# Patient Record
Sex: Female | Born: 1985 | Race: White | Hispanic: No | Marital: Married | Smoking: Never smoker
Health system: Southern US, Community
[De-identification: ages and names within clinical notes are randomized; demographics above are authoritative.]

## PROBLEM LIST (undated history)

## (undated) DIAGNOSIS — Z789 Other specified health status: Secondary | ICD-10-CM

## (undated) HISTORY — PX: TONSILLECTOMY: SUR1361

---
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
## 2003-11-04 ENCOUNTER — Other Ambulatory Visit: Admission: RE | Admit: 2003-11-04 | Discharge: 2003-11-04 | Payer: Self-pay | Admitting: Obstetrics & Gynecology

## 2005-01-10 ENCOUNTER — Other Ambulatory Visit: Admission: RE | Admit: 2005-01-10 | Discharge: 2005-01-10 | Payer: Self-pay | Admitting: Gynecology

## 2005-06-27 ENCOUNTER — Ambulatory Visit (HOSPITAL_COMMUNITY): Admission: RE | Admit: 2005-06-27 | Discharge: 2005-06-27 | Payer: Self-pay | Admitting: Family Medicine

## 2005-07-12 ENCOUNTER — Ambulatory Visit (HOSPITAL_COMMUNITY): Admission: RE | Admit: 2005-07-12 | Discharge: 2005-07-12 | Payer: Self-pay | Admitting: Urology

## 2006-02-27 ENCOUNTER — Other Ambulatory Visit: Admission: RE | Admit: 2006-02-27 | Discharge: 2006-02-27 | Payer: Self-pay | Admitting: Gynecology

## 2007-03-11 ENCOUNTER — Other Ambulatory Visit: Admission: RE | Admit: 2007-03-11 | Discharge: 2007-03-11 | Payer: Self-pay | Admitting: Gynecology

## 2007-07-23 ENCOUNTER — Ambulatory Visit (HOSPITAL_COMMUNITY): Admission: RE | Admit: 2007-07-23 | Discharge: 2007-07-23 | Payer: Self-pay | Admitting: Family Medicine

## 2007-07-25 ENCOUNTER — Ambulatory Visit (HOSPITAL_COMMUNITY): Admission: RE | Admit: 2007-07-25 | Discharge: 2007-07-25 | Payer: Self-pay | Admitting: Family Medicine

## 2009-12-28 LAB — RUBELLA ANTIBODY, IGM: Rubella: IMMUNE

## 2010-01-23 NOTE — L&D Delivery Note (Signed)
Delivery Note At 2:53 PM a viable female was delivered via Vaginal, Spontaneous Delivery (Presentation: Left Occiput Anterior).  APGAR: 9, 9; weight 8 lb 9.4 oz (3895 g).   Placenta status: Intact, Spontaneous.  Cord: 3 vessels with the following complications: None.  Cord pH: pending  Anesthesia: Epidural  Episiotomy: None Lacerations: 2nd degree Suture Repair: 2.0 chromic Est. Blood Loss (mL): 350  Mom to postpartum.  Baby to nursery-stable.  Annalyse Langlais C 08/07/2010, 3:14 PM

## 2010-07-11 LAB — STREP B DNA PROBE: GBS: NEGATIVE

## 2010-08-07 ENCOUNTER — Encounter (HOSPITAL_COMMUNITY): Payer: Self-pay | Admitting: Anesthesiology

## 2010-08-07 ENCOUNTER — Encounter (HOSPITAL_COMMUNITY): Payer: Self-pay | Admitting: Obstetrics and Gynecology

## 2010-08-07 ENCOUNTER — Inpatient Hospital Stay (HOSPITAL_COMMUNITY)
Admission: AD | Admit: 2010-08-07 | Discharge: 2010-08-09 | DRG: 775 | Disposition: A | Discharge status: Home / Self Care | Payer: PRIVATE HEALTH INSURANCE | Attending: Obstetrics and Gynecology | Admitting: Obstetrics and Gynecology

## 2010-08-07 HISTORY — DX: Other specified health status: Z78.9

## 2010-08-07 LAB — CBC
Platelets: 276 10*3/uL (ref 150–400)
RBC: 4.07 MIL/uL (ref 3.87–5.11)
RDW: 13.4 % (ref 11.5–15.5)
WBC: 11.9 10*3/uL — ABNORMAL HIGH (ref 4.0–10.5)

## 2010-08-07 LAB — RPR: RPR Ser Ql: NONREACTIVE

## 2010-08-07 MED ORDER — LACTATED RINGERS IV SOLN
INTRAVENOUS | Status: DC
  Administered 2010-08-07: 04:00:00 via INTRAVENOUS
  Administered 2010-08-07: 125 mL/h via INTRAVENOUS

## 2010-08-07 MED ORDER — OXYCODONE-ACETAMINOPHEN 5-325 MG PO TABS: 2.0000 | ORAL_TABLET | ORAL | Status: DC | PRN

## 2010-08-07 MED ORDER — OXYTOCIN 20 UNITS IN LACTATED RINGERS INFUSION - SIMPLE: 125.0000 mL/h | INTRAVENOUS | Status: DC | PRN

## 2010-08-07 MED ORDER — FLEET ENEMA 7-19 GM/118ML RE ENEM: 1.0000 | ENEMA | RECTAL | Status: DC | PRN

## 2010-08-07 MED ORDER — OXYTOCIN 20 UNITS IN LACTATED RINGERS INFUSION - SIMPLE
125.0000 mL/h | Freq: Once | INTRAVENOUS | Status: AC
  Administered 2010-08-07: 999 mL/h via INTRAVENOUS
  Filled 2010-08-07: qty 1000

## 2010-08-07 MED ORDER — ONDANSETRON HCL 4 MG/2ML IJ SOLN: 4.0000 mg | Freq: Four times a day (QID) | INTRAMUSCULAR | Status: DC | PRN

## 2010-08-07 MED ORDER — LIDOCAINE HCL (PF) 1 % IJ SOLN
30.0000 mL | INTRAMUSCULAR | Status: DC | PRN
  Filled 2010-08-07 (×2): qty 30

## 2010-08-07 MED ORDER — SENNOSIDES-DOCUSATE SODIUM 8.6-50 MG PO TABS
1.0000 | ORAL_TABLET | Freq: Every day | ORAL | Status: DC
  Administered 2010-08-07: 2 via ORAL
  Administered 2010-08-08: 1 via ORAL

## 2010-08-07 MED ORDER — DIPHENHYDRAMINE HCL 25 MG PO CAPS: 25.0000 mg | ORAL_CAPSULE | Freq: Four times a day (QID) | ORAL | Status: DC | PRN

## 2010-08-07 MED ORDER — IBUPROFEN 600 MG PO TABS
600.0000 mg | ORAL_TABLET | Freq: Four times a day (QID) | ORAL | Status: DC
  Administered 2010-08-07 – 2010-08-09 (×7): 600 mg via ORAL
  Filled 2010-08-07 (×8): qty 1

## 2010-08-07 MED ORDER — LIDOCAINE HCL (PF) 2 % IJ SOLN
INTRAMUSCULAR | Status: DC | PRN
  Administered 2010-08-07: 40 mg
  Administered 2010-08-07: 60 mg
  Administered 2010-08-07: 20 mg

## 2010-08-07 MED ORDER — EPHEDRINE 5 MG/ML INJ
10.0000 mg | INTRAVENOUS | Status: DC | PRN
  Filled 2010-08-07 (×2): qty 4

## 2010-08-07 MED ORDER — PHENYLEPHRINE 40 MCG/ML (10ML) SYRINGE FOR IV PUSH (FOR BLOOD PRESSURE SUPPORT)
80.0000 ug | PREFILLED_SYRINGE | INTRAVENOUS | Status: DC | PRN
  Filled 2010-08-07: qty 5

## 2010-08-07 MED ORDER — BENZOCAINE-MENTHOL 20-0.5 % EX AERO
INHALATION_SPRAY | CUTANEOUS | Status: AC
  Filled 2010-08-07: qty 56

## 2010-08-07 MED ORDER — PRENATAL PLUS 27-1 MG PO TABS
1.0000 | ORAL_TABLET | Freq: Every day | ORAL | Status: DC
  Administered 2010-08-08: 1 via ORAL
  Filled 2010-08-07: qty 1

## 2010-08-07 MED ORDER — LACTATED RINGERS IV SOLN: 500.0000 mL | Freq: Once | INTRAVENOUS | Status: DC

## 2010-08-07 MED ORDER — IBUPROFEN 600 MG PO TABS: 600.0000 mg | ORAL_TABLET | Freq: Four times a day (QID) | ORAL | Status: DC | PRN

## 2010-08-07 MED ORDER — ACETAMINOPHEN 325 MG PO TABS: 650.0000 mg | ORAL_TABLET | ORAL | Status: DC | PRN

## 2010-08-07 MED ORDER — LANOLIN HYDROUS EX OINT: TOPICAL_OINTMENT | CUTANEOUS | Status: DC | PRN

## 2010-08-07 MED ORDER — WITCH HAZEL-GLYCERIN EX PADS: MEDICATED_PAD | CUTANEOUS | Status: DC | PRN

## 2010-08-07 MED ORDER — PHENYLEPHRINE 40 MCG/ML (10ML) SYRINGE FOR IV PUSH (FOR BLOOD PRESSURE SUPPORT)
80.0000 ug | PREFILLED_SYRINGE | INTRAVENOUS | Status: DC | PRN
  Filled 2010-08-07 (×2): qty 5

## 2010-08-07 MED ORDER — ONDANSETRON HCL 4 MG PO TABS: 4.0000 mg | ORAL_TABLET | ORAL | Status: DC | PRN

## 2010-08-07 MED ORDER — CITRIC ACID-SODIUM CITRATE 334-500 MG/5ML PO SOLN: 30.0000 mL | ORAL | Status: DC | PRN

## 2010-08-07 MED ORDER — OXYCODONE-ACETAMINOPHEN 5-325 MG PO TABS
1.0000 | ORAL_TABLET | ORAL | Status: DC | PRN
  Filled 2010-08-07: qty 2

## 2010-08-07 MED ORDER — FENTANYL 2.5 MCG/ML BUPIVACAINE 1/10 % EPIDURAL INFUSION (WH - ANES)
14.0000 mL/h | INTRAMUSCULAR | Status: DC
  Administered 2010-08-07 (×3): 14 mL/h via EPIDURAL
  Filled 2010-08-07 (×3): qty 60

## 2010-08-07 MED ORDER — EPHEDRINE 5 MG/ML INJ
10.0000 mg | INTRAVENOUS | Status: DC | PRN
  Filled 2010-08-07: qty 4

## 2010-08-07 MED ORDER — ONDANSETRON HCL 4 MG/2ML IJ SOLN: 4.0000 mg | INTRAMUSCULAR | Status: DC | PRN

## 2010-08-07 MED ORDER — MEDROXYPROGESTERONE ACETATE 150 MG/ML IM SUSP: 150.0000 mg | INTRAMUSCULAR | Status: DC | PRN

## 2010-08-07 MED ORDER — LACTATED RINGERS IV SOLN
500.0000 mL | INTRAVENOUS | Status: AC | PRN
  Administered 2010-08-07: 500 mL via INTRAVENOUS

## 2010-08-07 MED ORDER — DIPHENHYDRAMINE HCL 50 MG/ML IJ SOLN: 12.5000 mg | INTRAMUSCULAR | Status: DC | PRN

## 2010-08-07 MED ORDER — BENZOCAINE-MENTHOL 20-0.5 % EX AERO: 1.0000 "application " | INHALATION_SPRAY | CUTANEOUS | Status: DC | PRN

## 2010-08-07 MED ORDER — MEASLES, MUMPS & RUBELLA VAC ~~LOC~~ INJ
0.5000 mL | INJECTION | Freq: Once | SUBCUTANEOUS | Status: DC
  Filled 2010-08-07: qty 0.5

## 2010-08-07 MED ORDER — TETANUS-DIPHTH-ACELL PERTUSSIS 5-2.5-18.5 LF-MCG/0.5 IM SUSP
0.5000 mL | Freq: Once | INTRAMUSCULAR | Status: AC
  Administered 2010-08-08: 0.5 mL via INTRAMUSCULAR
  Filled 2010-08-07: qty 0.5

## 2010-08-07 MED ORDER — SIMETHICONE 80 MG PO CHEW: 80.0000 mg | CHEWABLE_TABLET | ORAL | Status: DC | PRN

## 2010-08-07 MED ORDER — ZOLPIDEM TARTRATE 5 MG PO TABS: 5.0000 mg | ORAL_TABLET | Freq: Every evening | ORAL | Status: DC | PRN

## 2010-08-07 NOTE — ED Notes (Signed)
Report called to [employer] in [facility]

## 2010-08-07 NOTE — Progress Notes (Signed)
G1 at 40wks. Ctxs since Friday but closer for last 2 hrs. Some mucousy d/c.

## 2010-08-07 NOTE — ED Notes (Signed)
To BS via w/c °

## 2010-08-07 NOTE — H&P (Signed)
Joanna Murphy is a 25 y.o. female presenting for evaluation of labor with regular strong contractions. History OB History    Grav Para Term Preterm Abortions TAB SAB Ect Mult Living   1         0     Past Medical History  Diagnosis Date  . No pertinent past medical history    Past Surgical History  Procedure Date  . Tonsillectomy   . Knee arthroscopy 2001    R knee   Family History: family history includes Hypertension in her father. Social History:  reports that she has never smoked. She does not have any smokeless tobacco history on file. She reports that she does not drink alcohol or use illicit drugs.  ROS  Dilation: 6.5 Effacement (%): 90 Station: -1 Exam by:: B.Cagna,RN Blood pressure 123/69, pulse 93, temperature 98.3 F (36.8 C), temperature source Oral, resp. rate 18, height 5' 4.5" (1.638 m), weight 87.544 kg (193 lb). Examexam per triage rn Physical Exam  Prenatal labs: ABO, Rh:   Antibody: Negative (12/06 0000) Rubella:   RPR: Nonreactive (12/06 0000)  HBsAg: Negative (12/06 0000)  HIV:    GBS: Negative (06/18 0000)   Assessment/Plan: Term pregnancy with no prenatal complications.  Plan arom and anticipate svd   Ferdinando Lodge C 08/07/2010, 9:03 AM

## 2010-08-07 NOTE — Progress Notes (Signed)
Dr Rana Snare notified of pt's admission and status. Aware of ctx pattern, sve, and irreg. FHR. FHR reactive. Will walk an hour and reck cervix. If no change after an hour pt maygo home with [place]

## 2010-08-07 NOTE — ED Notes (Signed)
Pt up to walk with spouse

## 2010-08-07 NOTE — Anesthesia Procedure Notes (Addendum)
Epidural Patient location during procedure: OB Start time: 08/07/2010 5:53 AM  Staffing Anesthesiologist: Jiles Garter  Preanesthetic Checklist Completed: patient identified, site marked, surgical consent, pre-op evaluation, timeout performed, IV checked, risks and benefits discussed and monitors and equipment checked  Epidural Patient position: sitting Prep: DuraPrep Patient monitoring: continuous pulse ox and blood pressure Approach: midline Injection technique: LOR air  Needle:  Needle type: Tuohy  Needle gauge: 17 G Needle length: 9 cm Needle insertion depth: 6 cm Catheter type: closed end flexible Catheter size: 19 Gauge Catheter at skin depth: 10 and 12 cm Test dose: negative  Assessment Events: blood not aspirated, injection not painful, no injection resistance, negative IV test and no paresthesia  Patient is more comfortable after epidural dosed. Please see RN's note for documentation of vital signs,and FHR which are stable.

## 2010-08-07 NOTE — Anesthesia Preprocedure Evaluation (Addendum)
Anesthesia Evaluation  Name, MR# and DOB Patient awake  General Assessment Comment  Reviewed: Allergy & Precautions, H&P  and Patient's Chart, lab work & pertinent test results  Airway Mallampati: II TM Distance: >3 FB Neck ROM: full    Dental  (+) Teeth Intact   Pulmonary  clear to auscultation    Cardiovascular regular Normal   Neuro/Psych  GI/Hepatic/Renal (+)  GERD Medicated and Controlled     Endo/Other   Abdominal   Musculoskeletal  Hematology   Peds  Reproductive/Obstetrics (+) Pregnancy   Anesthesia Other Findings   276k plts              Anesthesia Physical Anesthesia Plan  ASA: II  Anesthesia Plan: Epidural   Post-op Pain Management:    Induction:   Airway Management Planned:   Additional Equipment:   Intra-op Plan:   Post-operative Plan:   Informed Consent: I have reviewed the patients History and Physical, chart, labs and discussed the procedure including the risks, benefits and alternatives for the proposed anesthesia with the patient or authorized representative who has indicated his/her understanding and acceptance.   Dental Advisory Given  Plan Discussed with: CRNA and Surgeon  Anesthesia Plan Comments: (Labs checked- platelets confirmed with RN in room. Fetal heart tracing, per RN, reportedly stable enough for sitting procedure. Discussed epidural, and patient consents to the procedure:  included risk of possible headache,backache, failed block, allergic reaction, and nerve injury. This patient was asked if she had any questions or concerns before the procedure started. )        Anesthesia Quick Evaluation

## 2010-08-08 LAB — CBC
HCT: 34.9 % — ABNORMAL LOW (ref 36.0–46.0)
Hemoglobin: 12 g/dL (ref 12.0–15.0)
MCH: 32.1 pg (ref 26.0–34.0)
MCV: 93.3 fL (ref 78.0–100.0)
RBC: 3.74 MIL/uL — ABNORMAL LOW (ref 3.87–5.11)

## 2010-08-08 LAB — KLEIHAUER-BETKE STAIN: Fetal Cells %: 0 %

## 2010-08-08 MED ORDER — RHO D IMMUNE GLOBULIN 1500 UNIT/2ML IJ SOLN
300.0000 ug | Freq: Once | INTRAMUSCULAR | Status: AC
  Administered 2010-08-08: 300 ug via INTRAMUSCULAR

## 2010-08-08 NOTE — Progress Notes (Signed)
Mom needs more practice in bringing baby to breast, waiting for wide gape; and then following through w/latch.  Joanna Murphy

## 2010-08-08 NOTE — Progress Notes (Addendum)
Post Partum Day 1 Subjective: no complaints  Objective: Blood pressure 108/68, pulse 84, temperature 97.7 F (36.5 C), temperature source Oral, resp. rate 14, height 5' 4.5" (1.638 m), weight 87.544 kg (193 lb), SpO2 98.00%, unknown if currently breastfeeding.  Physical Exam:  General: alert and no distress Lochia: appropriate Uterine Fundus: firm U/Even  DVT Evaluation: No evidence of DVT seen on physical exam.   Basename 08/08/10 0528 08/07/10 0343  HGB 12.0 12.8  HCT 34.9* 38.2    Assessment/Plan: Rh-, Rhogam if indicated Plan for discharge tomorrow   LOS: 1 day   CURTIS,CAROL G 08/08/2010, 7:51 AM    agree

## 2010-08-08 NOTE — Plan of Care (Signed)
Problem: Phase I Progression Outcomes Goal: Pain controlled with appropriate interventions Outcome: Progressing Patient claim good pain control with  Ibuprofen 600 mgs PO every 6 hr. Goal: Voiding adequately Outcome: Completed/Met Date Met:  08/08/10   Voided several times post partum without any problems. Goal: OOB as tolerated unless otherwise ordered Outcome: Adequate for Discharge Ambulated to bathroom and room independently without any problems. Goal: Initial discharge plan identified Outcome: Progressing  Informed patient and FOB of possible discharge date.

## 2010-08-09 LAB — RH IG WORKUP (INCLUDES ABO/RH): Unit division: 0

## 2010-08-09 MED ORDER — IBUPROFEN 600 MG PO TABS: 600.0000 mg | ORAL_TABLET | Freq: Four times a day (QID) | ORAL | Status: AC

## 2010-08-09 NOTE — Discharge Summary (Signed)
Obstetric Discharge Summary Reason for Admission: onset of labor Prenatal Procedures: none Intrapartum Procedures: spontaneous vaginal delivery Postpartum Procedures: Rho(D) Ig Complications-Operative and Postpartum: none  Hemoglobin  Date Value Range Status  08/08/2010 12.0  12.0-15.0 (g/dL) Final     HCT  Date Value Range Status  08/08/2010 34.9* 36.0-46.0 (%) Final    Discharge Diagnoses: Term Pregnancy-delivered  Discharge Information: Date: 08/09/2010 Activity: pelvic rest Diet: routine Medications: PNV and Ibuprophen Condition: stable Instructions: refer to practice specific booklet Discharge to: home   Newborn Data: Live born  Information for the patient's newborn:  Ilma, Achee [[id number]]  female  Home with mother.  Tabitha Riggins G 08/09/2010, 7:55 AM

## 2010-08-09 NOTE — Progress Notes (Signed)
Post Partum Day 2 Subjective: no complaints  Objective: Blood pressure 92/65, pulse 90, temperature 98.2 F (36.8 C), temperature source Oral, resp. rate 16, height 5' 4.5" (1.638 m), weight 87.544 kg (193 lb), SpO2 98.00%, unknown if currently breastfeeding.  Physical Exam:  General: alert, cooperative and no distress Lochia: appropriate Uterine Fundus: firm Perineum intact DVT Evaluation: No evidence of DVT seen on physical exam.   Basename 08/08/10 0528 08/07/10 0343  HGB 12.0 12.8  HCT 34.9* 38.2    Assessment/Plan: Discharge home RTO in 4-6 weeks   LOS: 2 days   Alahna Dunne G 08/09/2010, 7:53 AM

## 2013-01-23 NOTE — L&D Delivery Note (Signed)
Precipitous delivery of VFI at 0728 on 11/11/13.  EBL 200cc.  Placenta to L&D.  APGARs 9,9. Delivery occurred at change of shift.  Dr. Henderson Cloudomblin (coming off call) was awaiting delivery call in lounge.  RN called out for precipitous delivery and Dr. Tenny Crawoss was at nurse's station and responded.  Baby was delivered by RN prior to MD arrival.  Per RN, uncomplicated delivery.  On my arrival, vigorous baby on abdomen with intact cord.  Cord was clamped and cut.  Cord blood was obtained.  Placenta delivered S/I/3VC.  Fundus was firmed with pitocin and massage.  2nd degree perineal lac noted.  1% lido with epi injected.  3-0 vicryl used to repair lac in the normal fashion.  Mom and baby stable.   Joanna HonourMegan Romelle Muldoon, DO

## 2013-04-09 LAB — OB RESULTS CONSOLE RUBELLA ANTIBODY, IGM: RUBELLA: IMMUNE

## 2013-04-09 LAB — OB RESULTS CONSOLE GBS
GBS: POSITIVE
GBS: POSITIVE

## 2013-04-09 LAB — OB RESULTS CONSOLE RPR: RPR: NONREACTIVE

## 2013-04-09 LAB — OB RESULTS CONSOLE ABO/RH: RH TYPE: NEGATIVE

## 2013-04-09 LAB — OB RESULTS CONSOLE HIV ANTIBODY (ROUTINE TESTING): HIV: NONREACTIVE

## 2013-04-09 LAB — OB RESULTS CONSOLE HEPATITIS B SURFACE ANTIGEN: HEP B S AG: NEGATIVE

## 2013-04-09 LAB — OB RESULTS CONSOLE ANTIBODY SCREEN: Antibody Screen: NEGATIVE

## 2013-11-11 ENCOUNTER — Inpatient Hospital Stay (HOSPITAL_COMMUNITY)
Admission: AD | Admit: 2013-11-11 | Discharge: 2013-11-13 | DRG: 775 | Disposition: A | Discharge status: Home / Self Care | Payer: BC Managed Care – PPO | Source: Ambulatory Visit | Attending: Obstetrics & Gynecology | Admitting: Obstetrics & Gynecology

## 2013-11-11 ENCOUNTER — Encounter (HOSPITAL_COMMUNITY): Payer: Self-pay | Admitting: *Deleted

## 2013-11-11 DIAGNOSIS — Z3A39 39 weeks gestation of pregnancy: Secondary | ICD-10-CM | POA: Diagnosis present

## 2013-11-11 DIAGNOSIS — O471 False labor at or after 37 completed weeks of gestation: Secondary | ICD-10-CM | POA: Diagnosis present

## 2013-11-11 DIAGNOSIS — Z3490 Encounter for supervision of normal pregnancy, unspecified, unspecified trimester: Secondary | ICD-10-CM

## 2013-11-11 DIAGNOSIS — Z8249 Family history of ischemic heart disease and other diseases of the circulatory system: Secondary | ICD-10-CM | POA: Diagnosis not present

## 2013-11-11 LAB — CBC
HCT: 37.1 % (ref 36.0–46.0)
Hemoglobin: 13 g/dL (ref 12.0–15.0)
MCH: 32.2 pg (ref 26.0–34.0)
MCHC: 35 g/dL (ref 30.0–36.0)
MCV: 91.8 fL (ref 78.0–100.0)
PLATELETS: 270 10*3/uL (ref 150–400)
RBC: 4.04 MIL/uL (ref 3.87–5.11)
RDW: 13.9 % (ref 11.5–15.5)
WBC: 14.4 10*3/uL — ABNORMAL HIGH (ref 4.0–10.5)

## 2013-11-11 LAB — RPR

## 2013-11-11 MED ORDER — BENZOCAINE-MENTHOL 20-0.5 % EX AERO
1.0000 "application " | INHALATION_SPRAY | CUTANEOUS | Status: DC | PRN
  Administered 2013-11-11: 1 via TOPICAL
  Filled 2013-11-11: qty 56

## 2013-11-11 MED ORDER — SIMETHICONE 80 MG PO CHEW
80.0000 mg | CHEWABLE_TABLET | ORAL | Status: DC | PRN
  Administered 2013-11-11: 80 mg via ORAL
  Filled 2013-11-11: qty 1

## 2013-11-11 MED ORDER — DIBUCAINE 1 % RE OINT
1.0000 "application " | TOPICAL_OINTMENT | RECTAL | Status: DC | PRN
  Administered 2013-11-12: 1 via RECTAL
  Filled 2013-11-11: qty 28

## 2013-11-11 MED ORDER — ONDANSETRON HCL 4 MG/2ML IJ SOLN: 4.0000 mg | INTRAMUSCULAR | Status: DC | PRN

## 2013-11-11 MED ORDER — LACTATED RINGERS IV SOLN
INTRAVENOUS | Status: DC
  Administered 2013-11-11: 06:00:00 via INTRAVENOUS

## 2013-11-11 MED ORDER — PRENATAL MULTIVITAMIN CH
1.0000 | ORAL_TABLET | Freq: Every day | ORAL | Status: DC
  Administered 2013-11-11 – 2013-11-12 (×2): 1 via ORAL
  Filled 2013-11-11 (×2): qty 1

## 2013-11-11 MED ORDER — OXYCODONE-ACETAMINOPHEN 5-325 MG PO TABS: 1.0000 | ORAL_TABLET | ORAL | Status: DC | PRN

## 2013-11-11 MED ORDER — ONDANSETRON HCL 4 MG PO TABS
4.0000 mg | ORAL_TABLET | ORAL | Status: DC | PRN
Start: 2013-11-11 — End: 2013-11-13

## 2013-11-11 MED ORDER — SODIUM CHLORIDE 0.9 % IV SOLN
2.0000 g | Freq: Once | INTRAVENOUS | Status: AC
  Administered 2013-11-11: 2 g via INTRAVENOUS
  Filled 2013-11-11: qty 2000

## 2013-11-11 MED ORDER — ACETAMINOPHEN 325 MG PO TABS: 650.0000 mg | ORAL_TABLET | ORAL | Status: DC | PRN

## 2013-11-11 MED ORDER — IBUPROFEN 600 MG PO TABS
600.0000 mg | ORAL_TABLET | Freq: Four times a day (QID) | ORAL | Status: DC
  Administered 2013-11-11 – 2013-11-13 (×8): 600 mg via ORAL
  Filled 2013-11-11 (×7): qty 1

## 2013-11-11 MED ORDER — OXYTOCIN BOLUS FROM INFUSION
500.0000 mL | INTRAVENOUS | Status: DC
  Administered 2013-11-11: 500 mL via INTRAVENOUS

## 2013-11-11 MED ORDER — PHENYLEPHRINE 40 MCG/ML (10ML) SYRINGE FOR IV PUSH (FOR BLOOD PRESSURE SUPPORT)
80.0000 ug | PREFILLED_SYRINGE | INTRAVENOUS | Status: DC | PRN
  Filled 2013-11-11: qty 2

## 2013-11-11 MED ORDER — LACTATED RINGERS IV SOLN
500.0000 mL | Freq: Once | INTRAVENOUS | Status: AC
  Administered 2013-11-11: 500 mL via INTRAVENOUS

## 2013-11-11 MED ORDER — ONDANSETRON HCL 4 MG/2ML IJ SOLN: 4.0000 mg | Freq: Four times a day (QID) | INTRAMUSCULAR | Status: DC | PRN

## 2013-11-11 MED ORDER — TETANUS-DIPHTH-ACELL PERTUSSIS 5-2.5-18.5 LF-MCG/0.5 IM SUSP
0.5000 mL | Freq: Once | INTRAMUSCULAR | Status: DC
Start: 2013-11-12 — End: 2013-11-13

## 2013-11-11 MED ORDER — OXYTOCIN 40 UNITS IN LACTATED RINGERS INFUSION - SIMPLE MED
62.5000 mL/h | INTRAVENOUS | Status: DC
  Filled 2013-11-11: qty 1000

## 2013-11-11 MED ORDER — ZOLPIDEM TARTRATE 5 MG PO TABS: 5.0000 mg | ORAL_TABLET | Freq: Every evening | ORAL | Status: DC | PRN

## 2013-11-11 MED ORDER — FLEET ENEMA 7-19 GM/118ML RE ENEM: 1.0000 | ENEMA | RECTAL | Status: DC | PRN

## 2013-11-11 MED ORDER — DIPHENHYDRAMINE HCL 50 MG/ML IJ SOLN: 12.5000 mg | INTRAMUSCULAR | Status: DC | PRN

## 2013-11-11 MED ORDER — OXYCODONE-ACETAMINOPHEN 5-325 MG PO TABS
2.0000 | ORAL_TABLET | ORAL | Status: DC | PRN
Start: 2013-11-11 — End: 2013-11-13

## 2013-11-11 MED ORDER — RHO D IMMUNE GLOBULIN 1500 UNIT/2ML IJ SOSY
300.0000 ug | PREFILLED_SYRINGE | Freq: Once | INTRAMUSCULAR | Status: AC
  Administered 2013-11-12: 300 ug via INTRAMUSCULAR
  Filled 2013-11-11: qty 2

## 2013-11-11 MED ORDER — EPHEDRINE 5 MG/ML INJ
10.0000 mg | INTRAVENOUS | Status: DC | PRN
  Filled 2013-11-11: qty 2

## 2013-11-11 MED ORDER — LACTATED RINGERS IV SOLN: 500.0000 mL | INTRAVENOUS | Status: DC | PRN

## 2013-11-11 MED ORDER — CITRIC ACID-SODIUM CITRATE 334-500 MG/5ML PO SOLN: 30.0000 mL | ORAL | Status: DC | PRN

## 2013-11-11 MED ORDER — FENTANYL 2.5 MCG/ML BUPIVACAINE 1/10 % EPIDURAL INFUSION (WH - ANES): 14.0000 mL/h | INTRAMUSCULAR | Status: DC | PRN

## 2013-11-11 MED ORDER — WITCH HAZEL-GLYCERIN EX PADS: 1.0000 "application " | MEDICATED_PAD | CUTANEOUS | Status: DC | PRN

## 2013-11-11 MED ORDER — DIPHENHYDRAMINE HCL 25 MG PO CAPS: 25.0000 mg | ORAL_CAPSULE | Freq: Four times a day (QID) | ORAL | Status: DC | PRN

## 2013-11-11 MED ORDER — SENNOSIDES-DOCUSATE SODIUM 8.6-50 MG PO TABS
2.0000 | ORAL_TABLET | ORAL | Status: DC
  Administered 2013-11-12 – 2013-11-13 (×2): 2 via ORAL
  Filled 2013-11-11 (×2): qty 2

## 2013-11-11 MED ORDER — LIDOCAINE HCL (PF) 1 % IJ SOLN
30.0000 mL | INTRAMUSCULAR | Status: AC | PRN
  Administered 2013-11-11: 30 mL via SUBCUTANEOUS
  Filled 2013-11-11: qty 30

## 2013-11-11 MED ORDER — LANOLIN HYDROUS EX OINT: TOPICAL_OINTMENT | CUTANEOUS | Status: DC | PRN

## 2013-11-11 MED ORDER — OXYCODONE-ACETAMINOPHEN 5-325 MG PO TABS: 2.0000 | ORAL_TABLET | ORAL | Status: DC | PRN

## 2013-11-11 NOTE — Progress Notes (Signed)
Per Cristobal GoldmannAleeta, RN pt may transfer to room 166. Pt transferred via stretcher by Nursing supervisor.

## 2013-11-11 NOTE — H&P (Signed)
Joanna Murphy is a 28 y.o. female presenting for UCs. Maternal Medical History:  Reason for admission: Contractions.   Contractions: Onset was 3-5 hours ago.    Fetal activity: Perceived fetal activity is normal.      OB History   Grav Para Term Preterm Abortions TAB SAB Ect Mult Living   2 1 1       1      Past Medical History  Diagnosis Date  . No pertinent past medical history    Past Surgical History  Procedure Laterality Date  . Tonsillectomy    . Knee arthroscopy  2001    R knee   Family History: family history includes Hypertension in her father. Social History:  reports that she has never smoked. She does not have any smokeless tobacco history on file. She reports that she does not drink alcohol or use illicit drugs.   Prenatal Transfer Tool  Maternal Diabetes: No Genetic Screening: Normal Maternal Ultrasounds/Referrals: Normal Fetal Ultrasounds or other Referrals:  None Maternal Substance Abuse:  No Significant Maternal Medications:  None Significant Maternal Lab Results:  None Other Comments:  None  Review of Systems  Eyes: Negative for blurred vision.  Gastrointestinal: Negative for abdominal pain.  Neurological: Negative for headaches.    Dilation: 10 Effacement (%): 90 Station: 0 Exam by:: thomblin md Blood pressure 131/86, pulse 89, temperature 97.5 F (36.4 C), temperature source Oral, resp. rate 18, height 5\' 4"  (1.626 m), weight 83.915 kg (185 lb), SpO2 100.00%, unknown if currently breastfeeding. Maternal Exam:  Uterine Assessment: Contraction strength is firm.  Contraction frequency is regular.   Abdomen: Patient reports no abdominal tenderness. Fetal presentation: vertex     Fetal Exam Fetal State Assessment: Category I - tracings are normal.     Physical Exam  Cardiovascular: Normal rate.   Respiratory: Effort normal.  GI: Soft.  Neurological: She has normal reflexes.   AROM light meconium Prenatal labs: ABO, Rh:    Antibody:   Rubella:   RPR:    HBsAg:    HIV:    GBS:     Assessment/Plan: 28 yo G2P1 @ 39 2/7 weeks in active labor  Anticipate vaginal delivery  Joanna Murphy,Joanna Murphy 11/11/2013, 6:54 AM

## 2013-11-11 NOTE — MAU Note (Signed)
Pt reports contractions, ? Leaking fluid

## 2013-11-11 NOTE — Plan of Care (Signed)
Problem: Discharge Progression Outcomes Goal: MMR given as ordered Outcome: Not Applicable Date Met:  83/66/29 Rubella immune

## 2013-11-11 NOTE — Lactation Note (Signed)
This note was copied from the chart of [name]. Lactation Consultation Note  Patient Name: [name] HQION'GToday's Date: 11/11/2013 Reason for consult: Initial assessment of this mom and baby at 10 hours postpartum.  Mom is holding her newborn STS and baby is sound asleep.  Mom reports just finishing a breastfeeding of about 30 minutes, with some pauses during feeding.  Mom denies any nipple discomfort and states she knows how to hand express her colostrum/milk.  Mom is second-time breastfeeding mom with a 903 yo whom she breastfed for 7 weeks, stating that she could not continue when returning to work.  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  LC reviewed benefits of breastfeeding and encouraged cue feedings and frequent STS. Mom encouraged to feed baby 8-12 times/24 hours and with feeding cues. LC encouraged review of Baby and Me pp 9, 14 and 20-25 for STS and BF information. LC provided [employer] Resource brochure and reviewed [facility] services and list of community and web site resources.     Maternal Data Has patient been taught Hand Expression?:  (mom states that she knows how to hand express her colostrum/milk) Does the patient have breastfeeding experience prior to this delivery?: Yes  Feeding    LATCH Score/Interventions         Initial LATCH score=9 per RN assessment             Lactation Tools Discussed/Used   STS, cue feedings, hand expression  Consult Status Consult Status: Follow-up Date: 11/12/13 Follow-up type: In-patient    Warrick ParisianBryant, Kyson Kupper [facility] 11/11/2013, 5:32 PM

## 2013-11-11 NOTE — Progress Notes (Signed)
Called out to desk to call dr Henderson Cloudtomblin for delivery, then pulled emergency cord for back up staff

## 2013-11-12 LAB — CBC
HEMATOCRIT: 34.2 % — AB (ref 36.0–46.0)
Hemoglobin: 11.7 g/dL — ABNORMAL LOW (ref 12.0–15.0)
MCH: 32.1 pg (ref 26.0–34.0)
MCHC: 34.2 g/dL (ref 30.0–36.0)
MCV: 94 fL (ref 78.0–100.0)
Platelets: 223 10*3/uL (ref 150–400)
RBC: 3.64 MIL/uL — ABNORMAL LOW (ref 3.87–5.11)
RDW: 14.1 % (ref 11.5–15.5)
WBC: 11.1 10*3/uL — ABNORMAL HIGH (ref 4.0–10.5)

## 2013-11-12 NOTE — Progress Notes (Signed)
Post Partum Day 1 Subjective: no complaints, up ad lib, voiding, tolerating PO and + flatus  Objective: Blood pressure 110/62, pulse 81, temperature 97.6 F (36.4 C), temperature source Oral, resp. rate 18, height 5\' 4"  (1.626 m), weight 185 lb (83.915 kg), SpO2 100.00%, unknown if currently breastfeeding.  Physical Exam:  General: alert and cooperative Lochia: appropriate Uterine Fundus: firm Incision: healing well DVT Evaluation: No evidence of DVT seen on physical exam. Negative Homan's sign. No cords or calf tenderness. No significant calf/ankle edema.   Recent Labs  11/11/13 0620 11/12/13 0610  HGB 13.0 11.7*  HCT 37.1 34.2*    Assessment/Plan: Plan for discharge tomorrow   LOS: 1 day   Yuval Nolet G 11/12/2013, 8:08 AM

## 2013-11-12 NOTE — Lactation Note (Signed)
This note was copied from the chart of Joanna Riki AltesRebekah Pulsifer. Lactation Consultation Note  Follow up visit made.  Mom states baby is latching well and cluster feeding today.  Reassured this is normal behavior and encouraged to continue to feed with any feeding cue. Encouraged to use alternate breast massage and compression during feeding.  Instructed to call for concerns/assist prn.  Patient Name: Joanna Murphy ZOXWR'UToday's Date: 11/12/2013     Maternal Data    Feeding Feeding Type: Breast Fed Length of feed: 15 min  LATCH Score/Interventions                      Lactation Tools Discussed/Used     Consult Status      Huston FoleyMOULDEN, Ruthell Feigenbaum S 11/12/2013, 3:16 PM

## 2013-11-13 LAB — RH IG WORKUP (INCLUDES ABO/RH)
ABO/RH(D): O NEG
Antibody Screen: POSITIVE
Fetal Screen: NEGATIVE
Gestational Age(Wks): 39
Unit division: 0

## 2013-11-13 LAB — TYPE AND SCREEN
ABO/RH(D): O NEG
Antibody Screen: POSITIVE
DAT, IgG: NEGATIVE
Unit division: 0
Unit division: 0

## 2013-11-13 NOTE — Discharge Summary (Signed)
Obstetric Discharge Summary Reason for Admission: onset of labor Prenatal Procedures: ultrasound Intrapartum Procedures: spontaneous vaginal delivery Postpartum Procedures: none Complications-Operative and Postpartum: 2 degree perineal laceration Hemoglobin  Date Value Ref Range Status  11/12/2013 11.7* 12.0 - 15.0 g/dL Final     HCT  Date Value Ref Range Status  11/12/2013 34.2* 36.0 - 46.0 % Final    Physical Exam:  General: alert and cooperative Lochia: appropriate Uterine Fundus: firm Incision: healing well DVT Evaluation: No evidence of DVT seen on physical exam. Negative Homan's sign. No cords or calf tenderness. No significant calf/ankle edema.  Discharge Diagnoses: Term Pregnancy-delivered  Discharge Information: Date: 11/13/2013 Activity: pelvic rest Diet: routine Medications: PNV and Ibuprofen Condition: stable Instructions: refer to practice specific booklet Discharge to: home   Newborn Data: Live born female  Birth Weight: 8 lb 15.7 oz (4075 g) APGAR: 9, 9  Home with mother.  CURTIS,CAROL G 11/13/2013, 8:23 AM

## 2013-11-13 NOTE — Lactation Note (Signed)
This note was copied from the chart of Joanna Murphy Ruggerio. Lactation Consultation Note Baby has 8% wt. Loss. Noted 10 poops, 6 pee's since birth at 47 hrs. Old which is more than adequate. Could explain for some of the wt. Loss. Good I&O. Noted.  Patient Name: Joanna Murphy Robling ZOXWR'UToday's Date: 11/13/2013 Reason for consult: Follow-up assessment;Infant weight loss   Maternal Data    Feeding Feeding Type: Breast Fed Length of feed: 5 min  LATCH Score/Interventions                      Lactation Tools Discussed/Used     Consult Status Consult Status: Follow-up Date: 11/13/13 Follow-up type: In-patient    Charyl DancerCARVER, Larico Dimock G 11/13/2013, 6:39 AM

## 2013-11-24 ENCOUNTER — Encounter (HOSPITAL_COMMUNITY): Payer: Self-pay | Admitting: *Deleted

## 2016-03-15 ENCOUNTER — Other Ambulatory Visit: Payer: Self-pay | Admitting: Obstetrics and Gynecology

## 2016-03-15 DIAGNOSIS — N644 Mastodynia: Secondary | ICD-10-CM

## 2016-03-15 DIAGNOSIS — R922 Inconclusive mammogram: Secondary | ICD-10-CM

## 2016-03-17 ENCOUNTER — Other Ambulatory Visit: Payer: PRIVATE HEALTH INSURANCE

## 2016-03-17 ENCOUNTER — Ambulatory Visit
Admission: RE | Admit: 2016-03-17 | Discharge: 2016-03-17 | Disposition: A | Discharge status: Home / Self Care | Payer: 59 | Source: Ambulatory Visit | Attending: Obstetrics and Gynecology | Admitting: Obstetrics and Gynecology

## 2016-03-17 DIAGNOSIS — R922 Inconclusive mammogram: Secondary | ICD-10-CM

## 2016-03-17 DIAGNOSIS — N644 Mastodynia: Secondary | ICD-10-CM

## 2019-05-29 ENCOUNTER — Other Ambulatory Visit: Payer: Self-pay | Admitting: Allergy and Immunology

## 2019-05-29 ENCOUNTER — Ambulatory Visit
Admission: RE | Admit: 2019-05-29 | Discharge: 2019-05-29 | Disposition: A | Discharge status: Home / Self Care | Payer: 59 | Source: Ambulatory Visit | Attending: Allergy and Immunology | Admitting: Allergy and Immunology

## 2019-05-29 DIAGNOSIS — J4540 Moderate persistent asthma, uncomplicated: Secondary | ICD-10-CM

## 2022-04-17 IMAGING — DX DG CHEST 2V
2 series · 2 of 2 positions shown · non-contrast
Comparison: None.

CLINICAL DATA: Asthma with wheezing

EXAM:
CHEST - 2 VIEW

[dg chest 2 view (1 of 2)]
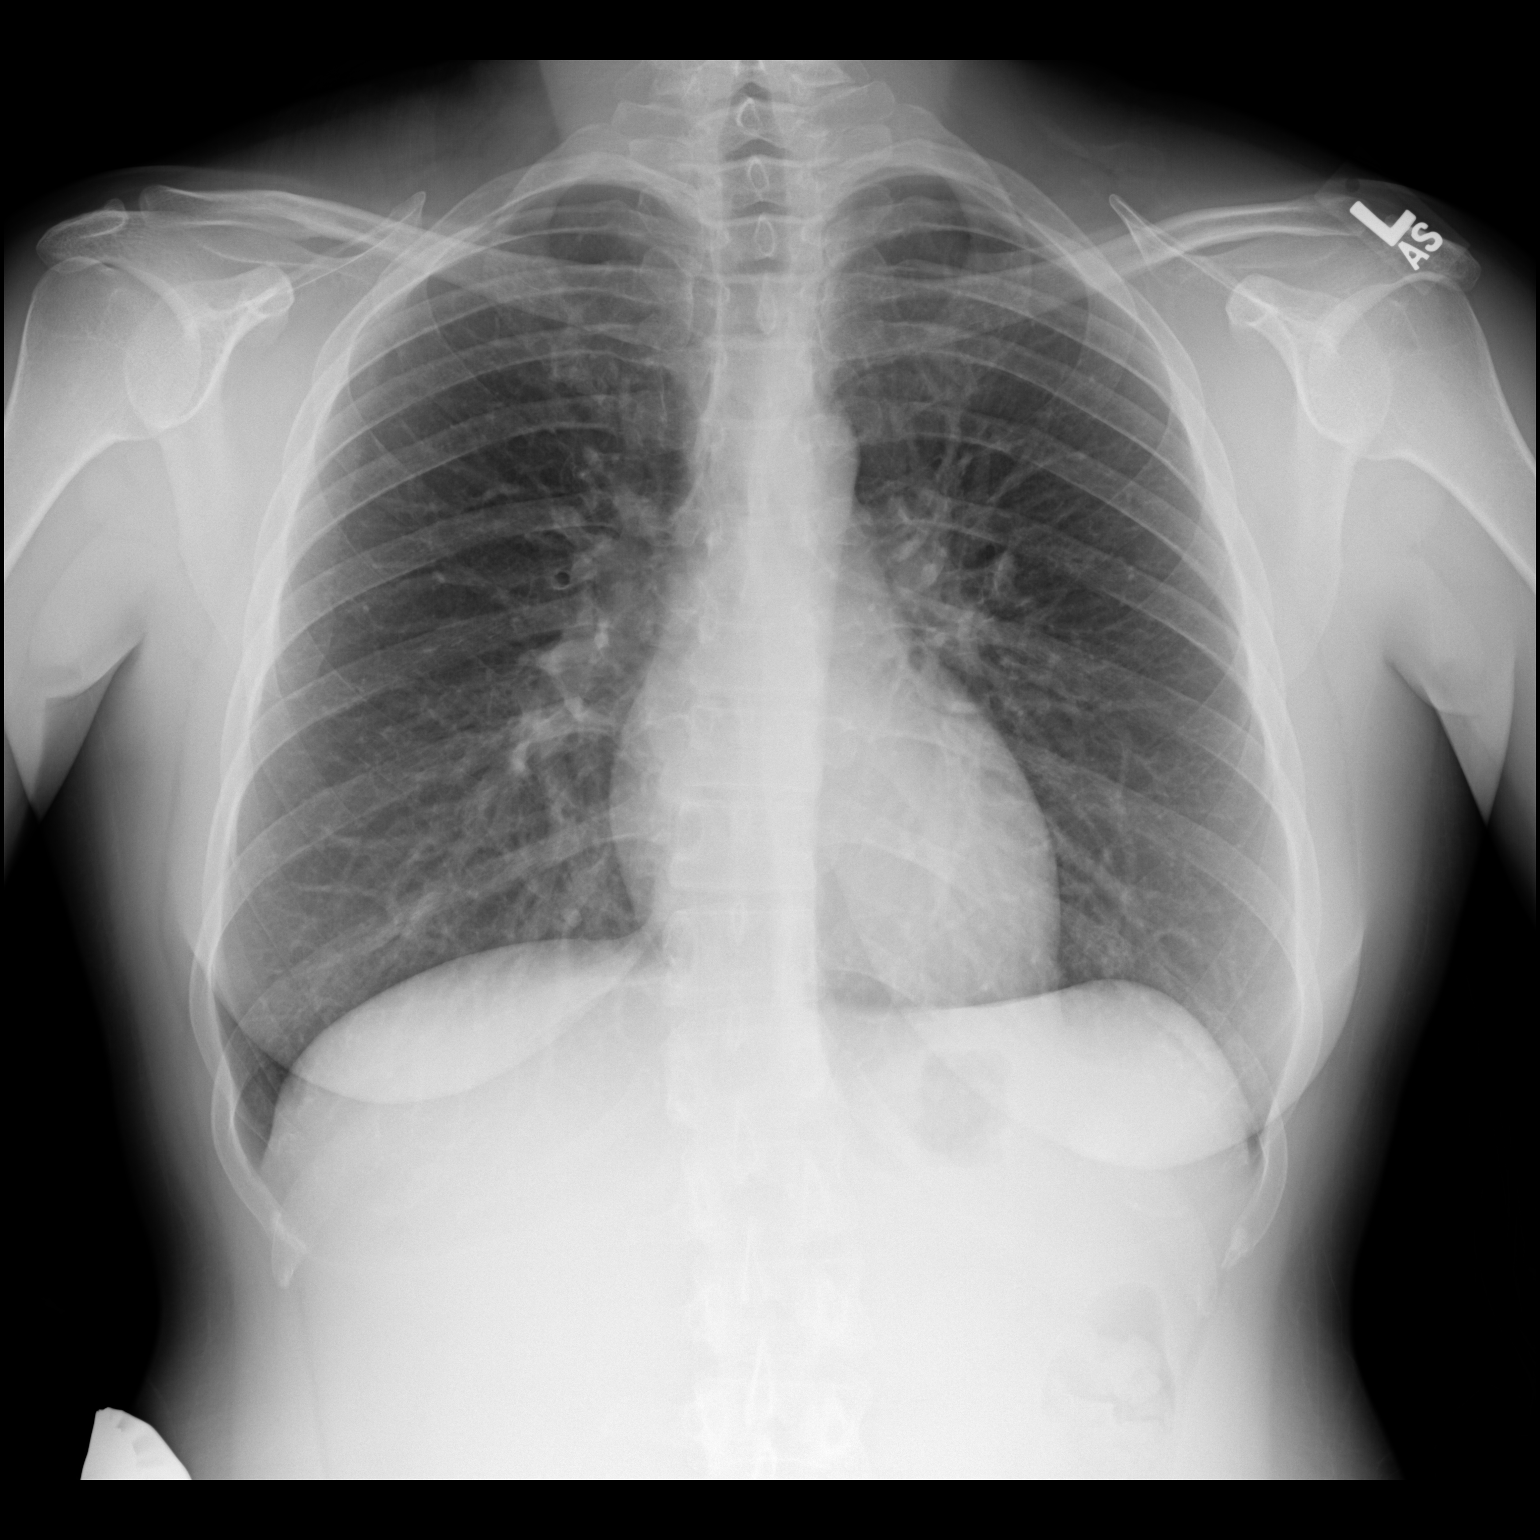

[dg chest 2 view (2 of 2)]
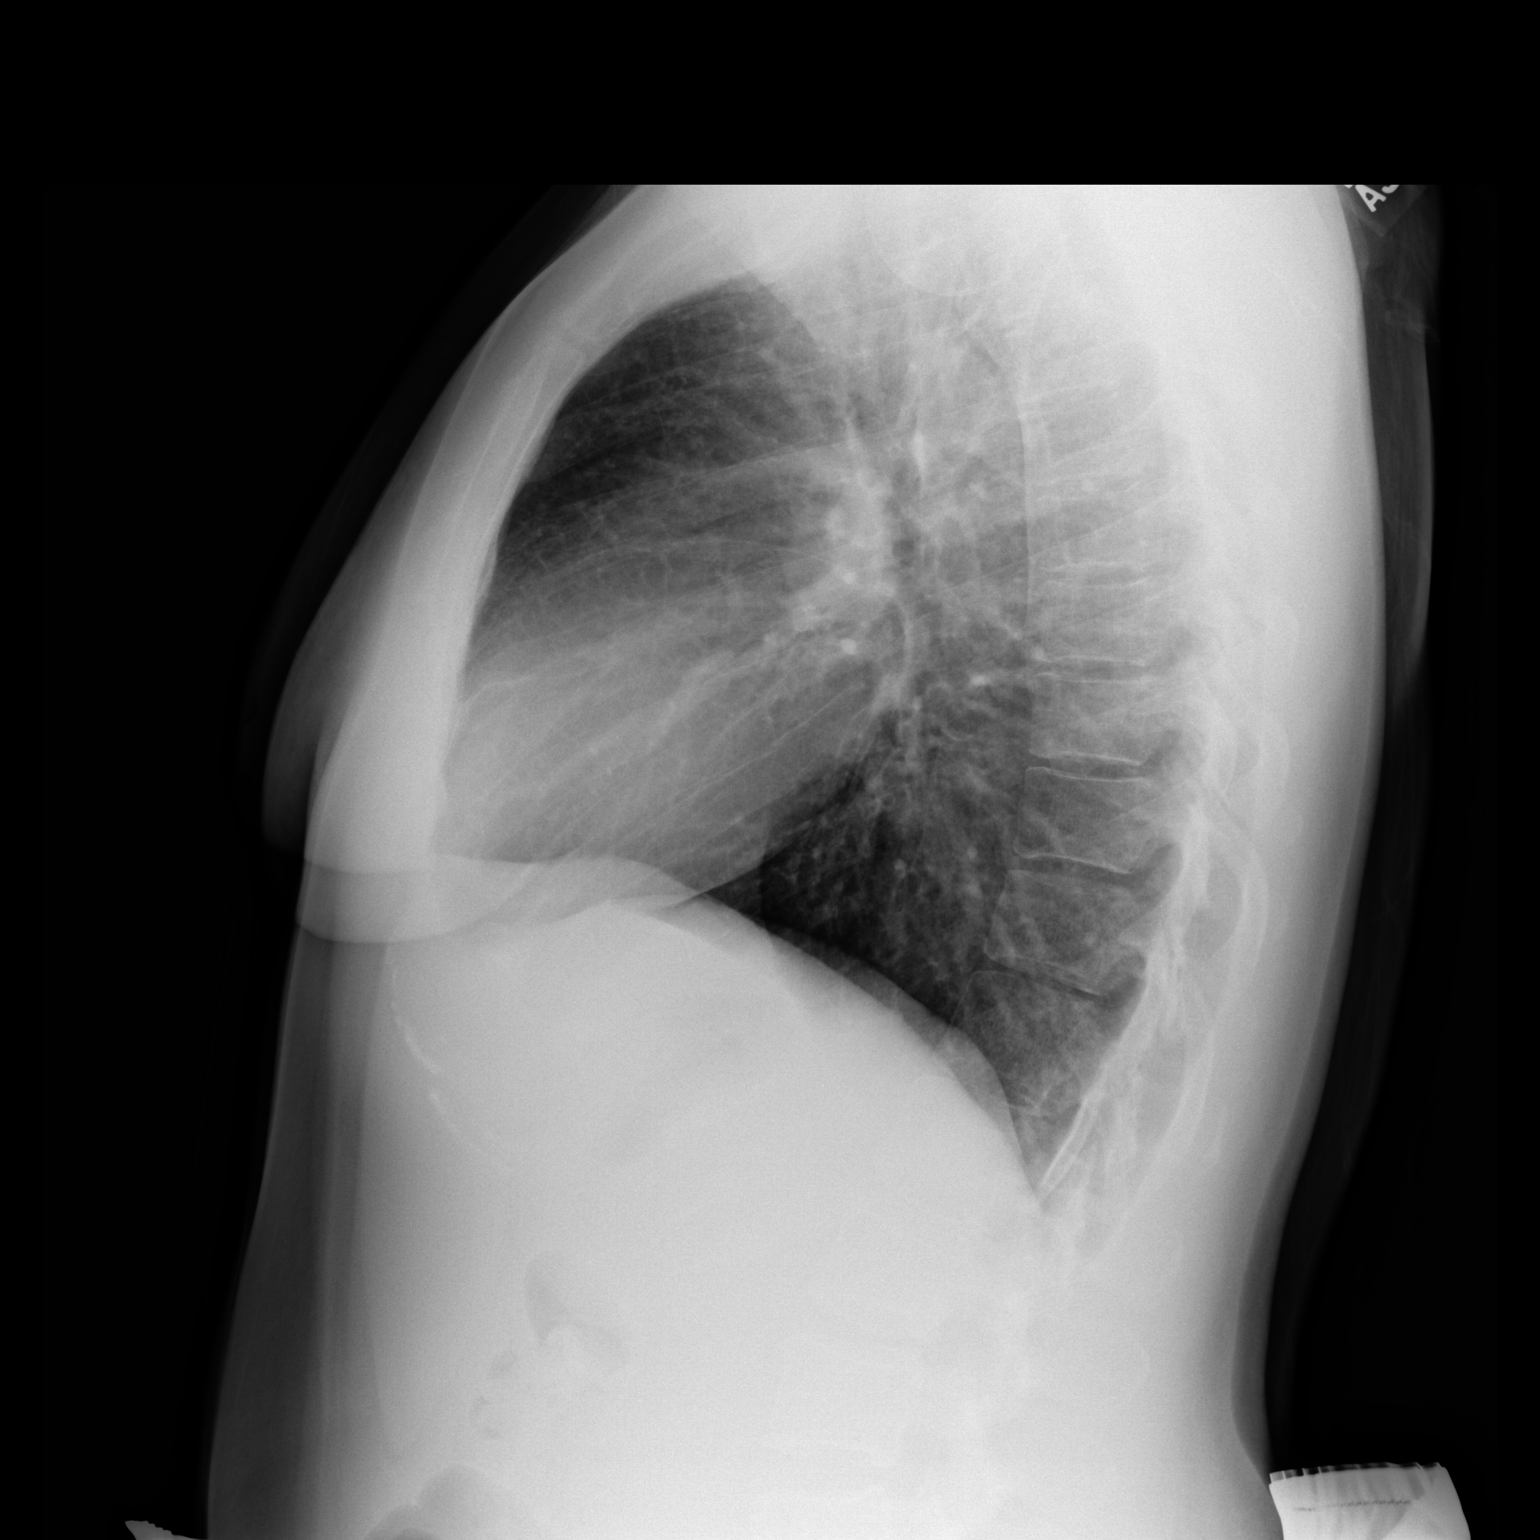

[2 of 2 positions shown; findings below may reference images not displayed]

FINDINGS: Lungs are clear. Heart size and pulmonary vascularity are normal. No
adenopathy. No bone lesions.
IMPRESSION: No abnormality noted.

## 2024-02-28 ENCOUNTER — Ambulatory Visit
Admission: EM | Admit: 2024-02-28 | Discharge: 2024-02-28 | Disposition: A | Discharge status: Home / Self Care | Source: Home / Self Care

## 2024-02-28 ENCOUNTER — Ambulatory Visit (INDEPENDENT_AMBULATORY_CARE_PROVIDER_SITE_OTHER)

## 2024-02-28 DIAGNOSIS — R0789 Other chest pain: Secondary | ICD-10-CM | POA: Diagnosis not present

## 2024-02-28 DIAGNOSIS — M25512 Pain in left shoulder: Secondary | ICD-10-CM | POA: Diagnosis not present

## 2024-02-28 DIAGNOSIS — W19XXXA Unspecified fall, initial encounter: Secondary | ICD-10-CM

## 2024-02-28 LAB — POCT URINE PREGNANCY: Preg Test, Ur: NEGATIVE

## 2024-02-28 MED ORDER — TIZANIDINE HCL 4 MG PO TABS: 4.0000 mg | ORAL_TABLET | Freq: Three times a day (TID) | ORAL | 0 refills | Status: AC | PRN

## 2024-02-28 NOTE — ED Provider Notes (Signed)
 " [facility]    CSN: [id number] Arrival date & time: 02/28/24  0820      History   Chief Complaint No chief complaint on file.   HPI Joanna Murphy is a 39 y.o. female.   Patient presents with left-sided chest wall/rib pain and left shoulder pain after fall that occurred on 1/29.  Patient reports that her child was sliding and ran into her and she fell hard on her left side.  Patient reports that she has had left-sided chest wall/rib pain since.  Patient reports that yesterday she began to have shooting pain to her left shoulder with movement as well.  Patient reports that she does have discomfort when laying down to sleep at night and has been sitting up to sleep because of this.  Patient denies any difficulty breathing.  Patient denies any other injuries from the fall.  Patient also denies hitting her head or loss of consciousness.  Patient reports that she has been taking ibuprofen  here and there with minimal relief.  The history is provided by the patient and medical records.    Past Medical History:  Diagnosis Date   No pertinent past medical history     Patient Active Problem List   Diagnosis Date Noted   Pregnancy 11/11/2013   Spontaneous vaginal delivery 08/09/2010    Past Surgical History:  Procedure Laterality Date   KNEE ARTHROSCOPY  2001   R knee   TONSILLECTOMY      OB History     Gravida  2   Para  2   Term  2   Preterm      AB      Living  2      SAB      IAB      Ectopic      Multiple      Live Births  2            Home Medications    Prior to Admission medications  Medication Sig Start Date End Date Taking? Authorizing Provider  albuterol (VENTOLIN HFA) 108 (90 Base) MCG/ACT inhaler Inhale 2 puffs into the lungs every 4 (four) hours as needed for shortness of breath or wheezing. 10/12/23  Yes [provider]  ARNUITY ELLIPTA 100 MCG/ACT AEPB Inhale 1 puff into the lungs daily. 12/28/23  Yes [provider]  azelastine (ASTELIN) 0.1 % nasal spray Place 2 sprays into both nostrils 2 (two) times daily. 10/12/23  Yes [provider]  levonorgestrel (MIRENA) 20 MCG/DAY IUD 1 each by Intrauterine route once.   Yes [provider]  Olopatadine HCl 0.2 % SOLN 1 drop into affected eye Ophthalmic Once a day or prn; Duration: 30 days 10/12/23  Yes [provider]  tiZANidine  (ZANAFLEX ) 4 MG tablet Take 1 tablet (4 mg total) by mouth every 8 (eight) hours as needed for muscle spasms. 02/28/24  Yes Johnie, Zenith Kercheval A, NP  desloratadine (CLARINEX) 5 MG tablet Take 5 mg by mouth daily.    [provider]  fluticasone (FLONASE) 50 MCG/ACT nasal spray Place 2 sprays into both nostrils daily.    [provider]  montelukast (SINGULAIR) 10 MG tablet Take 10 mg by mouth at bedtime.    [provider]    Family History Family History  Problem Relation Age of Onset   Hypertension Father     Social History Social History[1]   Allergies   Codeine   Review of Systems Review of  Systems  Per HPI  Physical Exam Triage Vital Signs ED Triage Vitals  Encounter Vitals Group     BP 02/28/24 0839 (!) 144/85     Girls Systolic BP Percentile --      Girls Diastolic BP Percentile --      Boys Systolic BP Percentile --      Boys Diastolic BP Percentile --      Pulse Rate 02/28/24 0839 83     Resp 02/28/24 0839 16     Temp 02/28/24 0839 98.5 F (36.9 C)     Temp Source 02/28/24 0839 Oral     SpO2 02/28/24 0839 97 %     Weight --      Height --      Head Circumference --      Peak Flow --      Pain Score 02/28/24 0841 10     Pain Loc --      Pain Education --      Exclude from Growth Chart --    No data found.  Updated Vital Signs BP (!) 144/85 (BP Location: Right Arm)   Pulse 83   Temp 98.5 F (36.9 C) (Oral)   Resp 16   SpO2 97%   Breastfeeding No   Visual Acuity Right Eye Distance:   Left Eye Distance:   Bilateral Distance:     Right Eye Near:   Left Eye Near:    Bilateral Near:     Physical Exam Vitals and nursing note reviewed.  Constitutional:      General: She is awake. She is not in acute distress.    Appearance: Normal appearance. She is well-developed and well-groomed. She is not ill-appearing.  Chest:     Chest wall: Tenderness present.       Comments: Mild tenderness noted just below left breast along bra line.  Without bruising or obvious deformity. Musculoskeletal:     Left shoulder: Tenderness present. No swelling or deformity. Normal range of motion. Normal strength. Normal pulse.       Arms:     Comments: Tenderness noted over left superior trapezius  Skin:    General: Skin is warm and dry.  Neurological:     General: No focal deficit present.     Mental Status: She is alert and oriented to person, place, and time. Mental status is at baseline.  Psychiatric:        Behavior: Behavior is cooperative.      UC Treatments / Results  Labs (all labs ordered are listed, but only abnormal results are displayed) Labs Reviewed  POCT URINE PREGNANCY - Normal    EKG   Radiology DG Ribs Unilateral W/Chest Left Result Date: 02/28/2024 CLINICAL DATA:  rib and shoulder pain after fall EXAM: LEFT RIBS AND CHEST - 3+ VIEW COMPARISON:  Chest radiographs 05/29/2019 FINDINGS: The heart size and mediastinal contours are stable. The lungs are clear. No pleural effusion or pneumothorax. No evidence of acute rib fracture or focal rib lesion. Minimal thoracolumbar scoliosis. IMPRESSION: No evidence of acute rib fracture, pleural effusion or pneumothorax. Electronically Signed   By: Elsie Perone M.D.   On: 02/28/2024 09:25   DG Shoulder Left Result Date: 02/28/2024 CLINICAL DATA:  Rib and shoulder pain after falling. EXAM: LEFT SHOULDER - 2+ VIEW COMPARISON:  None Available. FINDINGS: The mineralization and alignment are normal. There is no evidence of acute fracture or dislocation. The joint spaces  appear preserved. No focal soft tissue abnormalities are seen.  IMPRESSION: No evidence of acute fracture or dislocation. Electronically Signed   By: Elsie Perone M.D.   On: 02/28/2024 09:20    Procedures Procedures (including critical care time)  Medications Ordered in UC Medications - No data to display  Initial Impression / Assessment and Plan / UC Course  I have reviewed the triage vital signs and the nursing notes.  Pertinent labs & imaging results that were available during my care of the patient were reviewed by me and considered in my medical decision making (see chart for details).     Patient is overall well-appearing.  Vitals are stable.  X-rays ordered.  I independently interpreted these images and there are no acute findings at this time.  Pain likely muscular in nature.  Prescribed tizanidine  for muscle pain and spasms.  Recommended alternate between Tylenol  and ibuprofen  as needed for pain.  Given orthopedic follow-up if needed.  Discussed follow-up and return precautions. Final Clinical Impressions(s) / UC Diagnoses   Final diagnoses:  Left-sided chest wall pain  Acute pain of left shoulder  Fall, initial encounter     Discharge Instructions      Your x-rays did not reveal any fractures or underlying injuries. I believe your pain is likely muscular in nature. You can take 1 tablet of tizanidine  every 8 hours as needed for muscle pain and spasms. Otherwise alternate between 400 to 600 mg of ibuprofen  and 500 to 1000 mg of Tylenol  every 6-8 hours as needed for pain. Alternate between ice and heat as needed for pain. You can follow-up with [facility] if your pain continues for further evaluation. Otherwise follow-up with your primary care provider or return here as needed.     ED Prescriptions     Medication Sig Dispense Auth. Provider   tiZANidine  (ZANAFLEX ) 4 MG tablet Take 1 tablet (4 mg total) by mouth every 8 (eight) hours as needed for muscle spasms.  30 tablet Johnie Flaming A, NP      PDMP not reviewed this encounter.     [1]  Social History Tobacco Use   Smoking status: Never  Substance Use Topics   Alcohol use: No   Drug use: No     Johnie Flaming LABOR, NP 02/28/24 [phone]  "

## 2024-02-28 NOTE — Discharge Instructions (Addendum)
 Your x-rays did not reveal any fractures or underlying injuries. I believe your pain is likely muscular in nature. You can take 1 tablet of tizanidine  every 8 hours as needed for muscle pain and spasms. Otherwise alternate between 400 to 600 mg of ibuprofen  and 500 to 1000 mg of Tylenol  every 6-8 hours as needed for pain. Alternate between ice and heat as needed for pain. You can follow-up with [facility] if your pain continues for further evaluation. Otherwise follow-up with your primary care provider or return here as needed.

## 2024-02-28 NOTE — ED Triage Notes (Signed)
 Pt reports left shoulder and rib pain from a fall that x 7 days ago. Pain is under the breast area and shooting pains in the left shoulder blade, as well as pain in the neck. Has discomfort when laying down to sleep has been sleeping sitting up.
